# Patient Record
Sex: Male | Born: 1970 | Race: Black or African American | Hispanic: No | Marital: Single | Smoking: Current every day smoker
Health system: Southern US, Community
[De-identification: ages and names within clinical notes are randomized; demographics above are authoritative.]

## PROBLEM LIST (undated history)

## (undated) DIAGNOSIS — W3400XA Accidental discharge from unspecified firearms or gun, initial encounter: Secondary | ICD-10-CM

## (undated) HISTORY — PX: APPENDECTOMY: SHX54

---

## 2017-02-12 ENCOUNTER — Emergency Department (HOSPITAL_COMMUNITY)
Admission: EM | Admit: 2017-02-12 | Discharge: 2017-02-12 | Disposition: A | Discharge status: Home / Self Care | Payer: Self-pay | Attending: Emergency Medicine | Admitting: Emergency Medicine

## 2017-02-12 ENCOUNTER — Emergency Department (HOSPITAL_COMMUNITY): Payer: Self-pay

## 2017-02-12 ENCOUNTER — Encounter (HOSPITAL_COMMUNITY): Payer: Self-pay

## 2017-02-12 DIAGNOSIS — F17210 Nicotine dependence, cigarettes, uncomplicated: Secondary | ICD-10-CM | POA: Insufficient documentation

## 2017-02-12 DIAGNOSIS — K1120 Sialoadenitis, unspecified: Secondary | ICD-10-CM | POA: Insufficient documentation

## 2017-02-12 HISTORY — DX: Accidental discharge from unspecified firearms or gun, initial encounter: W34.00XA

## 2017-02-12 LAB — I-STAT CHEM 8, ED
BUN: 16 mg/dL (ref 6–20)
CALCIUM ION: 1.15 mmol/L (ref 1.15–1.40)
CHLORIDE: 106 mmol/L (ref 101–111)
Creatinine, Ser: 1.3 mg/dL — ABNORMAL HIGH (ref 0.61–1.24)
Glucose, Bld: 100 mg/dL — ABNORMAL HIGH (ref 65–99)
HCT: 47 % (ref 39.0–52.0)
Hemoglobin: 16 g/dL (ref 13.0–17.0)
POTASSIUM: 4.5 mmol/L (ref 3.5–5.1)
SODIUM: 143 mmol/L (ref 135–145)
TCO2: 28 mmol/L (ref 0–100)

## 2017-02-12 MED ORDER — IOPAMIDOL (ISOVUE-300) INJECTION 61%
INTRAVENOUS | Status: AC
  Administered 2017-02-12: 75 mL via INTRAVENOUS
  Filled 2017-02-12: qty 75

## 2017-02-12 MED ORDER — CEPHALEXIN 500 MG PO CAPS: 500.0000 mg | ORAL_CAPSULE | Freq: Four times a day (QID) | ORAL | 0 refills | Status: AC

## 2017-02-12 NOTE — Discharge Instructions (Signed)
Please read attached information regarding your condition. Take Keflex 4 times daily for 10 days. Follow-up with PCP for further evaluation. Return to ED for worsening pain, fever, trouble with neck movement, trouble breathing or swallowing.
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
## 2017-02-12 NOTE — ED Triage Notes (Signed)
Pt presents with c/o left side neck pain that started last night. Pt reports he feels like the gland in his neck is swollen. Pt reports pain when he he uses listerine or brushes his teeth.

## 2017-12-29 IMAGING — CT CT NECK W/ CM
4 of 5 series · 16 of 33 positions shown, 18 images · IV contrast (iopamidol)
Comparison: None.

CLINICAL DATA: LEFT neck swelling beginning last night, oral pain.

EXAM:
CT NECK WITH CONTRAST
TECHNIQUE: Multidetector CT imaging of the neck was performed using the
standard protocol following the bolus administration of intravenous
contrast.
CONTRAST:  75 cc [id number] IOPAMIDOL (WKSH6L-N99) INJECTION 61%

[Series 2: neck with st · axial · 0.40mm/px · z∈[-272,-146]mm · 4 of 106 slices shown]
[im 22/106  bone]
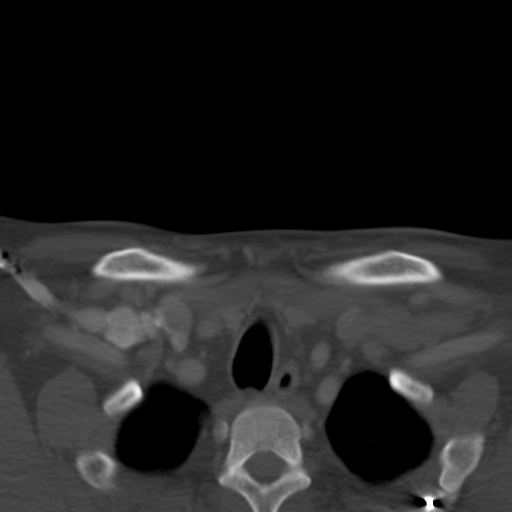
[im 43/106  bone]
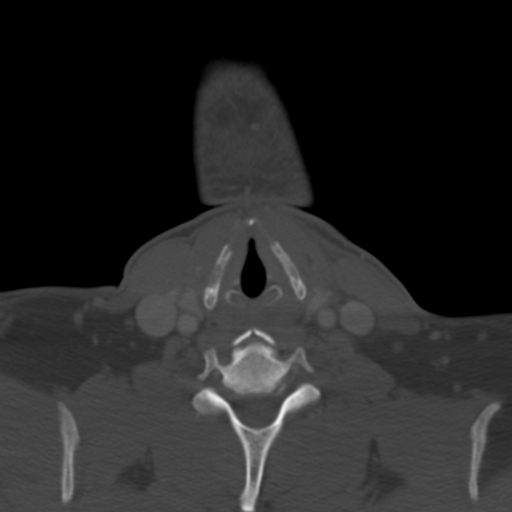
[im 64/106  bone]
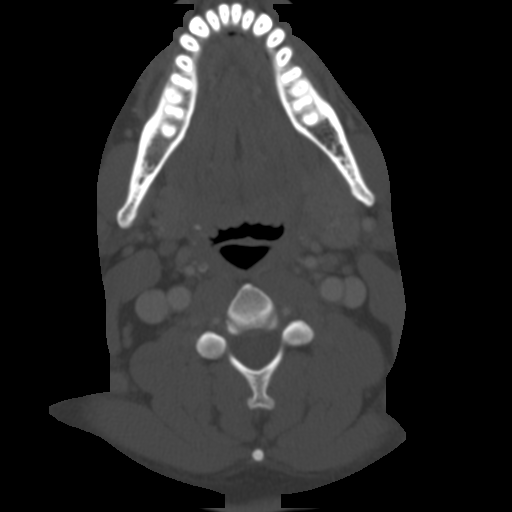
[im 85/106  bone]
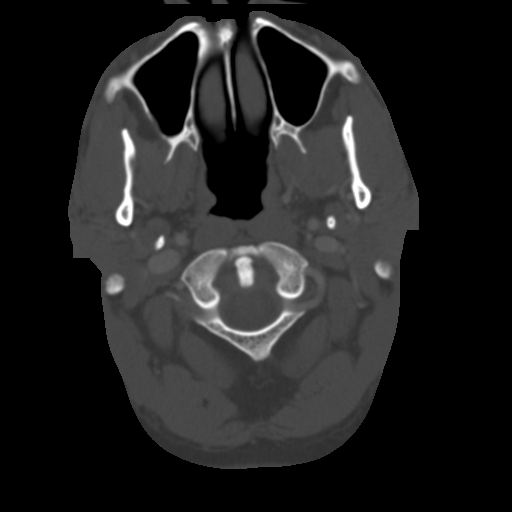

[Series 3: coronal st · coronal · 0.46mm/px · 3 of 106 slices shown]
[im 22/106  bone]
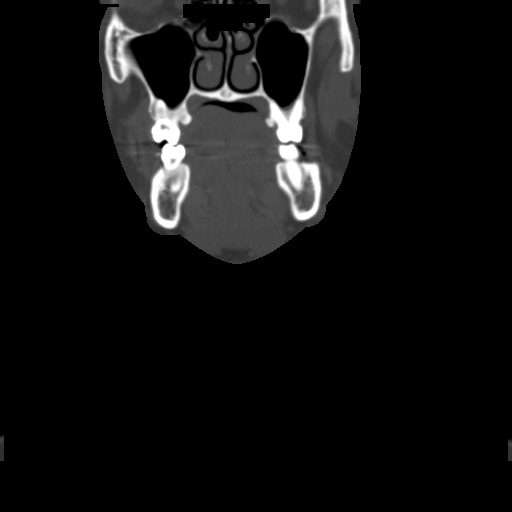
[im 43/106  bone]
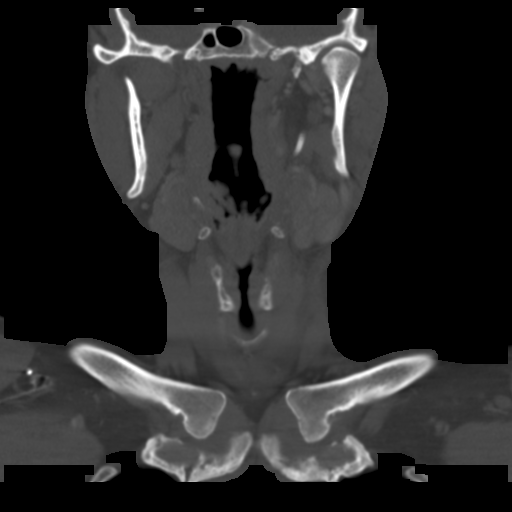
[im 64/106  bone]
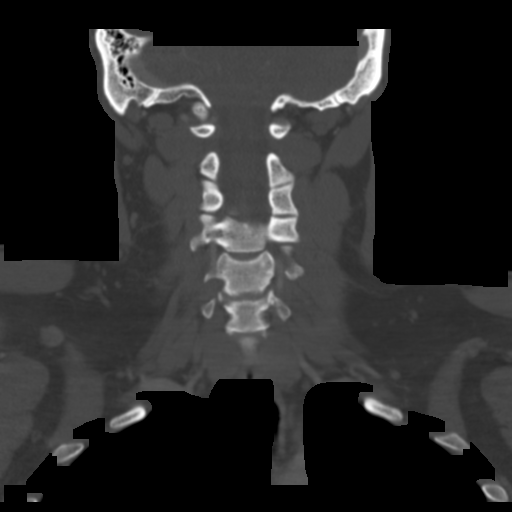

[Series 4: sagittal st · sagittal · 0.42mm/px · 5 of 101 slices shown, 6 images]
[im 34/101  bone]
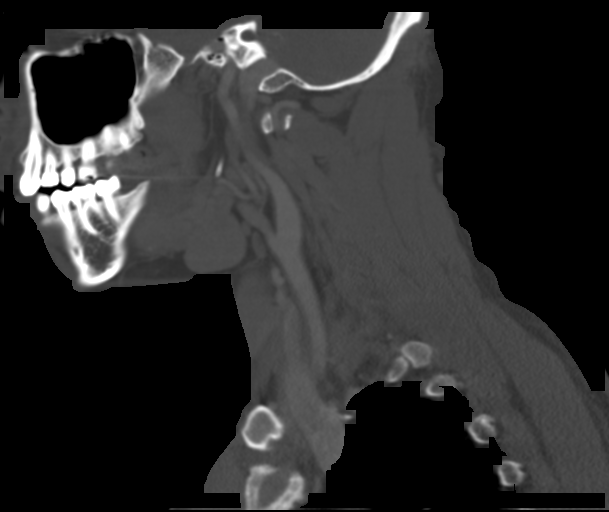
[im 42/101  bone]
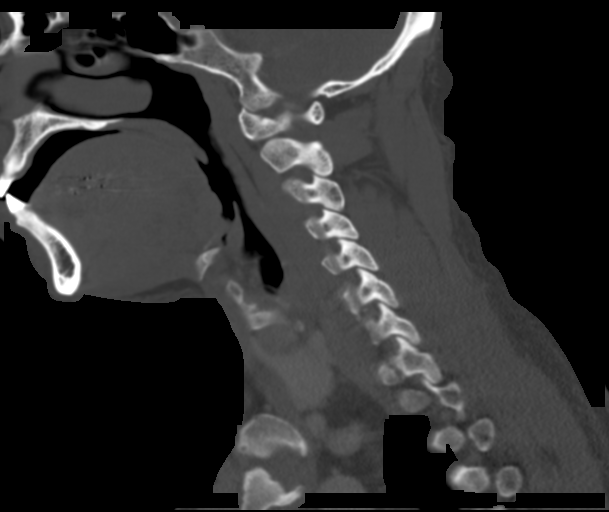
[im 51/101  soft-tissue]
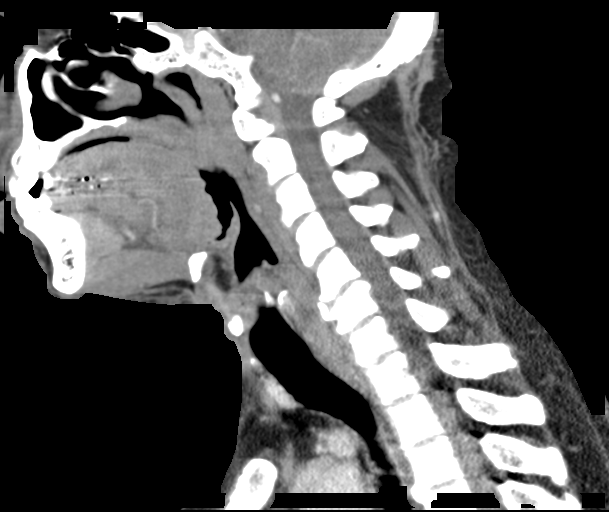
[im 51/101  bone]
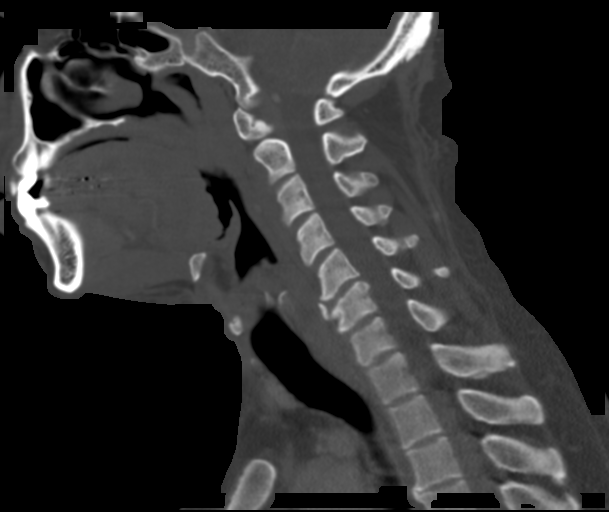
[im 59/101  bone]
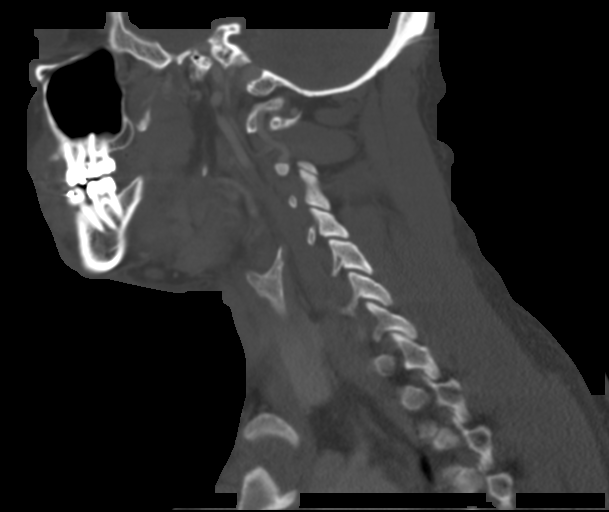
[im 67/101  bone]
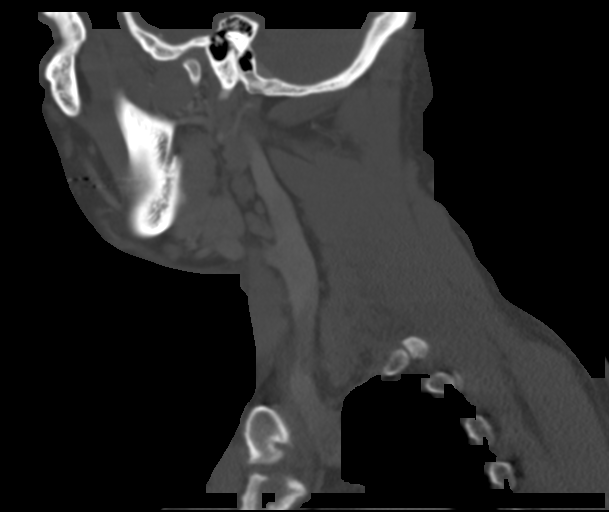

[Series 5: ax axial recons · axial · 0.39mm/px · z∈[-322,-189]mm · 4 of 127 slices shown, 5 images]
[im 26/127  soft-tissue]
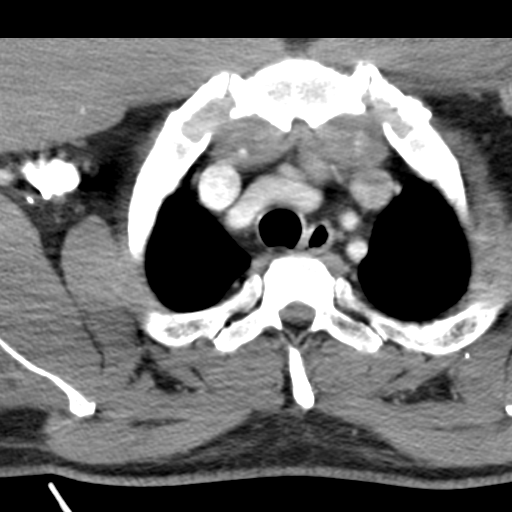
[im 26/127  bone]
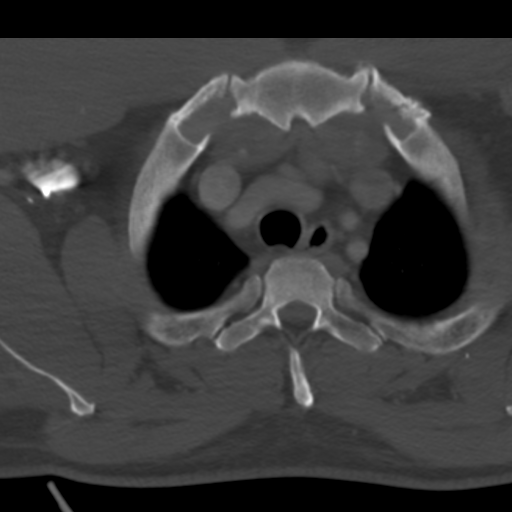
[im 51/127  bone]
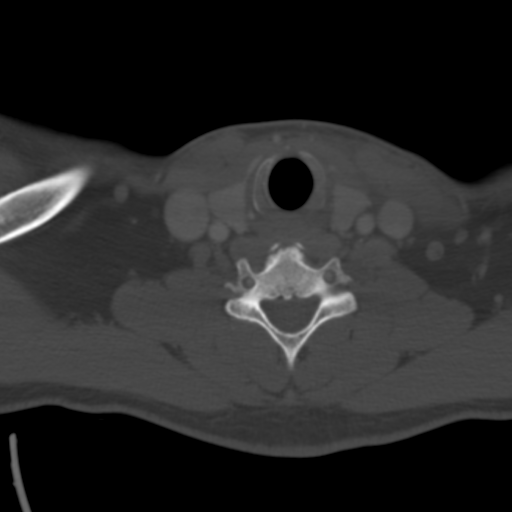
[im 76/127  bone]
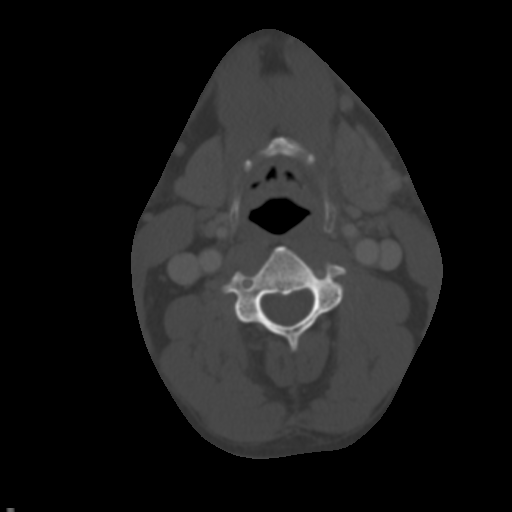
[im 101/127  bone]
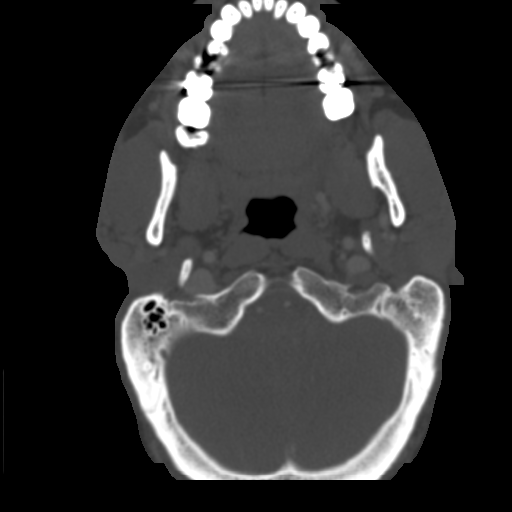

[16 of 33 positions shown; findings below may reference images not displayed]

FINDINGS: PHARYNX AND LARYNX: Normal.  Widely patent airway.

SALIVARY GLANDS: LEFT submandibular gland is enlarged, mildly
edematous. With fat stranding LEFT submandibular space with trace
effusion. No sialolith or mass. Remaining major salivary glands are
unremarkable.

THYROID: Normal.

LYMPH NODES: No lymphadenopathy by CT size criteria.

VASCULAR: Normal.

LIMITED INTRACRANIAL: Normal.

VISUALIZED ORBITS: Normal.

MASTOIDS AND VISUALIZED PARANASAL SINUSES: Well-aerated. RIGHT
concha bullosa.

SKELETON: Nonacute.  Tooth 1 dental Blatt.

UPPER CHEST: Lung apices are clear. No superior mediastinal
lymphadenopathy.

OTHER: Mildly edematous lower face with subcutaneous fat stranding
mildly thickened platysma without fluid collection.
IMPRESSION: 1. LEFT submandibular sialoadenitis without sialolith.
